# Patient Record
Sex: Male | Born: 1970 | Race: Black or African American | Hispanic: Refuse to answer | Marital: Single | Smoking: Never smoker
Health system: Southern US, Community
[De-identification: ages and names within clinical notes are randomized; demographics above are authoritative.]

---

## 2002-03-17 ENCOUNTER — Ambulatory Visit (HOSPITAL_COMMUNITY): Admission: RE | Admit: 2002-03-17 | Discharge: 2002-03-17 | Payer: Self-pay | Admitting: Orthopedic Surgery

## 2002-03-17 ENCOUNTER — Encounter: Payer: Self-pay | Admitting: Orthopedic Surgery

## 2004-08-23 ENCOUNTER — Emergency Department (HOSPITAL_COMMUNITY): Admission: EM | Admit: 2004-08-23 | Discharge: 2004-08-23 | Payer: Self-pay | Admitting: Emergency Medicine

## 2005-12-25 ENCOUNTER — Emergency Department (HOSPITAL_COMMUNITY): Admission: EM | Admit: 2005-12-25 | Discharge: 2005-12-25 | Payer: Self-pay | Admitting: Family Medicine

## 2008-02-24 ENCOUNTER — Emergency Department (HOSPITAL_COMMUNITY): Admission: EM | Admit: 2008-02-24 | Discharge: 2008-02-24 | Payer: Self-pay | Admitting: Family Medicine

## 2008-02-29 ENCOUNTER — Emergency Department (HOSPITAL_COMMUNITY): Admission: EM | Admit: 2008-02-29 | Discharge: 2008-02-29 | Payer: Self-pay | Admitting: Family Medicine

## 2012-05-22 ENCOUNTER — Encounter (HOSPITAL_COMMUNITY): Payer: Self-pay | Admitting: *Deleted

## 2012-05-22 ENCOUNTER — Emergency Department (HOSPITAL_COMMUNITY)
Admission: EM | Admit: 2012-05-22 | Discharge: 2012-05-22 | Disposition: A | Discharge status: Home / Self Care | Payer: Self-pay | Source: Home / Self Care | Attending: Emergency Medicine | Admitting: Emergency Medicine

## 2012-05-22 DIAGNOSIS — L0291 Cutaneous abscess, unspecified: Secondary | ICD-10-CM

## 2012-05-22 DIAGNOSIS — L0390 Cellulitis, unspecified: Secondary | ICD-10-CM

## 2012-05-22 DIAGNOSIS — R52 Pain, unspecified: Secondary | ICD-10-CM

## 2012-05-22 MED ORDER — TRAMADOL HCL 50 MG PO TABS: 50.0000 mg | ORAL_TABLET | Freq: Four times a day (QID) | ORAL | Status: AC | PRN

## 2012-05-22 MED ORDER — DOXYCYCLINE HYCLATE 100 MG PO CAPS: 100.0000 mg | ORAL_CAPSULE | Freq: Two times a day (BID) | ORAL | Status: AC

## 2012-05-22 NOTE — ED Provider Notes (Signed)
Medical screening examination/treatment/procedure(s) were performed by non-physician practitioner and as supervising physician I was immediately available for consultation/collaboration.  Luiz Blare MD   Luiz Blare, MD 05/22/12 2103

## 2012-05-22 NOTE — ED Notes (Signed)
Pt reports abscess in center of chest

## 2012-05-22 NOTE — ED Provider Notes (Signed)
History     CSN: [id number]  Arrival date & time 05/22/12  1807   First MD Initiated Contact with Patient 05/22/12 1828      Chief Complaint  Patient presents with  . Abscess    (Consider location/radiation/quality/duration/timing/severity/associated sxs/prior treatment) Patient is a 41 y.o. male presenting with abscess. The history is provided by the patient.  Abscess   This patient presents for evaluation of a cutaneous abscess.  The lesion is located in the chest.  Onset was 5 days ago.  Symptoms have been worsening.  Abscess has associated symptoms of tenderness, minimal drainage.  The patient does not have previous history of cutaneous abscesses.  There is no known previous history of MRSA.   They do not have a history of diabetes. Has applied warm compresses with minimal relief in symptoms.   History reviewed. No pertinent past medical history.  History reviewed. No pertinent past surgical history.  Family History  Problem Relation Age of Onset  . Family history unknown: Yes    History  Substance Use Topics  . Smoking status: Never Smoker   . Smokeless tobacco: Not on file  . Alcohol Use: Yes     socially      Review of Systems  Constitutional: Negative.   Respiratory: Negative.   Cardiovascular: Negative.   Skin: Positive for wound. Negative for color change, pallor and rash.    Allergies  Review of patient's allergies indicates no known allergies.  Home Medications   Current Outpatient Rx  Name Route Sig Dispense Refill  . DOXYCYCLINE HYCLATE 100 MG PO CAPS Oral Take 1 capsule (100 mg total) by mouth 2 (two) times daily. 14 capsule 0  . TRAMADOL HCL 50 MG PO TABS Oral Take 1 tablet (50 mg total) by mouth every 6 (six) hours as needed for pain. 15 tablet 0    BP 121/75  Pulse 70  Temp 98.1 F (36.7 C) (Oral)  Resp 16  SpO2 99%  Physical Exam  Nursing note and vitals reviewed. Constitutional: He is oriented to person, place, and time.  Vital signs are normal. He appears well-developed and well-nourished. He is active and cooperative.  HENT:  Head: Normocephalic.  Eyes: Conjunctivae are normal. Pupils are equal, round, and reactive to light. No scleral icterus.  Neck: Trachea normal. Neck supple.  Cardiovascular: Normal rate, regular rhythm and normal heart sounds.   Pulmonary/Chest: Effort normal and breath sounds normal.         3 x 3 abscess on anterior chest, tenderness and erythema noted.  Lymphadenopathy:    He has no cervical adenopathy.    He has no axillary adenopathy.  Neurological: He is alert and oriented to person, place, and time. No cranial nerve deficit or sensory deficit.  Skin: Skin is warm and dry.  Psychiatric: He has a normal mood and affect. His speech is normal and behavior is normal. Judgment and thought content normal. Cognition and memory are normal.    ED Course  INCISION AND DRAINAGE Date/Time: 05/22/2012 7:34 PM Performed by: Johnsie Kindred Authorized by: Luiz Blare Consent: Verbal consent obtained. Risks and benefits: risks, benefits and alternatives were discussed Consent given by: patient Patient understanding: patient states understanding of the procedure being performed Patient consent: the patient's understanding of the procedure matches consent given Procedure consent: procedure consent matches procedure scheduled Site marked: the operative site was marked Patient identity confirmed: verbally with patient and arm band Time out: Immediately prior to procedure a "time out"  was called to verify the correct patient, procedure, equipment, support staff and site/side marked as required. Type: abscess Body area: trunk Location details: chest Anesthesia: local infiltration Local anesthetic: lidocaine 1% without epinephrine Anesthetic total: 2 ml Patient sedated: no Scalpel size: 11 Needle gauge: 22 Incision type: elliptical (cross) Complexity: simple Drainage:  purulent and bloody Drainage amount: moderate Wound treatment: wound left open Patient tolerance: Patient tolerated the procedure well with no immediate complications.   (including critical care time)   Labs Reviewed  CULTURE, ROUTINE-ABSCESS   No results found.   1. Abscess and cellulitis   2. Pain       MDM  Take antibiotics as prescribed.  Take ibuprofen for pain and ultram for worse pain.  Follow up in 3 days for wound recheck.        Johnsie Kindred, NP 05/22/12 2050

## 2012-05-26 LAB — CULTURE, ROUTINE-ABSCESS: Special Requests: NORMAL

## 2019-11-17 ENCOUNTER — Ambulatory Visit: Payer: 59 | Admitting: Nurse Practitioner

## 2019-11-17 ENCOUNTER — Other Ambulatory Visit: Payer: Self-pay

## 2019-11-17 ENCOUNTER — Encounter: Payer: Self-pay | Admitting: Nurse Practitioner

## 2019-11-17 VITALS — BP 112/80 | HR 64 | Temp 98.2°F | Ht 65.0 in | Wt 138.8 lb

## 2019-11-17 DIAGNOSIS — Z0000 Encounter for general adult medical examination without abnormal findings: Secondary | ICD-10-CM

## 2019-11-17 DIAGNOSIS — Z125 Encounter for screening for malignant neoplasm of prostate: Secondary | ICD-10-CM

## 2019-11-17 DIAGNOSIS — Z23 Encounter for immunization: Secondary | ICD-10-CM

## 2019-11-17 DIAGNOSIS — H6122 Impacted cerumen, left ear: Secondary | ICD-10-CM

## 2019-11-17 LAB — POCT URINALYSIS DIPSTICK
Bilirubin, UA: NEGATIVE
Blood, UA: NEGATIVE
Glucose, UA: NEGATIVE
Ketones, UA: NEGATIVE
Leukocytes, UA: NEGATIVE
Nitrite, UA: NEGATIVE
Protein, UA: NEGATIVE
Spec Grav, UA: 1.015 (ref 1.010–1.025)
Urobilinogen, UA: 0.2 E.U./dL
pH, UA: 5.5 (ref 5.0–8.0)

## 2019-11-17 MED ORDER — TETANUS-DIPHTH-ACELL PERTUSSIS 5-2.5-18.5 LF-MCG/0.5 IM SUSP
0.5000 mL | Freq: Once | INTRAMUSCULAR | Status: AC
  Administered 2019-11-17: 0.5 mL via INTRAMUSCULAR

## 2019-11-17 NOTE — Progress Notes (Addendum)
This visit occurred during the SARS-CoV-2 public health emergency.  Safety protocols were in place, including screening questions prior to the visit, additional usage of staff PPE, and extensive cleaning of exam room while observing appropriate contact time as indicated for disinfecting solutions.  Subjective:     Patient ID: Ethan Davies , male    DOB: [birth year] , 49 y.o.   MRN: [id number]   Chief Complaint  Patient presents with  . Establish Care  . Annual Exam    HPI  Here to establish care - he was seeing a PCP in [place].  He had been here previously about 8 years ago.  When [employer] he stopped coming to the office.  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    No children, single.   Mother - lung cancer with metastasis to brain.  Father - aneurysm brain at age 53.  Brother - Chief Strategy Officer (healthy) and Sallie lung cancer.  Will have his labs sent to [place] from his life insurance.   Also, here for HM     History reviewed. No pertinent past medical history.   Family History  Problem Relation Age of Onset  . Cancer Mother   . Aneurysm Father   . Healthy Sister   . Healthy Brother   . Hypertension Brother     No current outpatient medications on file.   No Known Allergies   Review of Systems  Constitutional: Negative.   HENT: Negative.   Eyes: Negative.   Respiratory: Negative.   Cardiovascular: Negative.   Gastrointestinal: Negative.   Endocrine: Negative.   Genitourinary: Negative.   Musculoskeletal: Negative.   Skin: Negative.   Neurological: Negative.   Hematological: Negative.   Psychiatric/Behavioral: Negative.      Today's Vitals   11/17/19 1014  BP: 112/80  Pulse: 64  Temp: 98.2 F (36.8 C)  TempSrc: Oral  Weight: 138 lb 12.8 oz (63 kg)  Height: 5\' 5"  (1.651 m)  PainSc: 0-No pain   Body mass index is 23.1 kg/m.   Objective:  Physical Exam Vitals reviewed.  Constitutional:      General: He is not in acute distress.  Appearance: Normal appearance.  HENT:     Head: Normocephalic and atraumatic.     Right Ear: Tympanic membrane, ear canal and external ear normal. There is no impacted cerumen.     Left Ear: External ear normal. There is impacted cerumen (firm cerumen present in canal).  Cardiovascular:     Rate and Rhythm: Normal rate and regular rhythm.     Pulses: Normal pulses.     Heart sounds: Normal heart sounds. No murmur.  Pulmonary:     Effort: Pulmonary effort is normal. No respiratory distress.     Breath sounds: Normal breath sounds.  Abdominal:     General: Abdomen is flat. Bowel sounds are normal. There is no distension.     Palpations: Abdomen is soft.  Musculoskeletal:        General: Normal range of motion.     Cervical back: Normal range of motion and neck supple.  Skin:    General: Skin is warm.     Capillary Refill: Capillary refill takes less than 2 seconds.  Neurological:     General: No focal deficit present.     Mental Status: He is alert and oriented to person, place, and time.  Psychiatric:        Mood and Affect: Mood normal.  Behavior: Behavior normal.        Thought Content: Thought content normal.        Judgment: Judgment normal.         Assessment And Plan:     1. Health maintenance examination . Behavior modifications discussed and diet history reviewed.   . Pt will continue to exercise regularly and modify diet with low GI, plant based foods and decrease intake of processed foods.  . Recommend intake of daily multivitamin, Vitamin D, and calcium.  . Recommend to call insurance company to see if a colonoscopy for preventive screenings is covered before age 23, as well as recommend immunizations that include influenza, TDAP  2. Impacted cerumen of left ear  Water lavage done with good results  3. Encounter for prostate cancer screening  4. Encounter for immunization  Will give tetanus vaccine today while in office. Refer to order management. TDAP  will be administered to adults 62-25 years old every 10 years. - Tdap (BOOSTRIX) injection 0.5 mL    Arnette Felts, FNP    THE PATIENT IS ENCOURAGED TO PRACTICE SOCIAL DISTANCING DUE TO THE COVID-19 PANDEMIC.

## 2019-11-18 LAB — PSA: Prostate Specific Ag, Serum: 0.7 ng/mL (ref 0.0–4.0)

## 2020-01-12 ENCOUNTER — Ambulatory Visit: Payer: 59 | Attending: Internal Medicine

## 2020-01-12 DIAGNOSIS — Z20822 Contact with and (suspected) exposure to covid-19: Secondary | ICD-10-CM | POA: Insufficient documentation

## 2020-01-13 LAB — NOVEL CORONAVIRUS, NAA: SARS-CoV-2, NAA: NOT DETECTED

## 2020-01-13 LAB — SARS-COV-2, NAA 2 DAY TAT

## 2020-06-14 ENCOUNTER — Encounter: Payer: Self-pay | Admitting: Nurse Practitioner

## 2020-06-14 ENCOUNTER — Other Ambulatory Visit: Payer: Self-pay

## 2020-06-14 ENCOUNTER — Ambulatory Visit: Payer: 59 | Admitting: Nurse Practitioner

## 2020-06-14 VITALS — BP 118/68 | HR 69 | Temp 98.2°F | Ht 65.0 in | Wt 131.4 lb

## 2020-06-14 DIAGNOSIS — M79605 Pain in left leg: Secondary | ICD-10-CM | POA: Diagnosis not present

## 2020-06-14 DIAGNOSIS — M542 Cervicalgia: Secondary | ICD-10-CM | POA: Diagnosis not present

## 2020-06-14 DIAGNOSIS — M25512 Pain in left shoulder: Secondary | ICD-10-CM

## 2020-06-14 MED ORDER — KETOROLAC TROMETHAMINE 60 MG/2ML IM SOLN
60.0000 mg | Freq: Once | INTRAMUSCULAR | Status: AC
  Administered 2020-06-14: 60 mg via INTRAMUSCULAR

## 2020-06-14 MED ORDER — CYCLOBENZAPRINE HCL 10 MG PO TABS: 10.0000 mg | ORAL_TABLET | Freq: Three times a day (TID) | ORAL | 0 refills | Status: DC | PRN

## 2020-06-14 MED ORDER — IBUPROFEN 800 MG PO TABS: 800.0000 mg | ORAL_TABLET | Freq: Three times a day (TID) | ORAL | 0 refills | Status: DC | PRN
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
## 2020-06-15 ENCOUNTER — Ambulatory Visit
Admission: RE | Admit: 2020-06-15 | Discharge: 2020-06-15 | Disposition: A | Discharge status: Home / Self Care | Payer: 59 | Source: Ambulatory Visit | Attending: Nurse Practitioner | Admitting: Nurse Practitioner

## 2020-06-15 DIAGNOSIS — M25512 Pain in left shoulder: Secondary | ICD-10-CM

## 2020-06-15 DIAGNOSIS — M542 Cervicalgia: Secondary | ICD-10-CM

## 2020-07-14 ENCOUNTER — Encounter: Payer: Self-pay | Admitting: Nurse Practitioner

## 2020-11-18 ENCOUNTER — Encounter: Payer: 59 | Admitting: Nurse Practitioner

## 2021-07-12 ENCOUNTER — Other Ambulatory Visit: Payer: Self-pay | Admitting: General Practice

## 2021-07-12 DIAGNOSIS — R590 Localized enlarged lymph nodes: Secondary | ICD-10-CM

## 2021-07-13 ENCOUNTER — Ambulatory Visit
Admission: RE | Admit: 2021-07-13 | Discharge: 2021-07-13 | Disposition: A | Discharge status: Home / Self Care | Payer: 59 | Source: Ambulatory Visit | Attending: General Practice | Admitting: General Practice

## 2021-07-13 DIAGNOSIS — R590 Localized enlarged lymph nodes: Secondary | ICD-10-CM

## 2021-08-02 ENCOUNTER — Other Ambulatory Visit: Payer: Self-pay

## 2021-08-02 ENCOUNTER — Encounter: Payer: Self-pay | Admitting: Nurse Practitioner

## 2021-08-02 ENCOUNTER — Ambulatory Visit (INDEPENDENT_AMBULATORY_CARE_PROVIDER_SITE_OTHER): Payer: 59 | Admitting: Nurse Practitioner

## 2021-08-02 VITALS — BP 134/86 | HR 70 | Temp 97.8°F | Ht 63.8 in | Wt 134.4 lb

## 2021-08-02 DIAGNOSIS — R609 Edema, unspecified: Secondary | ICD-10-CM

## 2021-08-02 DIAGNOSIS — Z114 Encounter for screening for human immunodeficiency virus [HIV]: Secondary | ICD-10-CM

## 2021-08-02 DIAGNOSIS — Z6823 Body mass index (BMI) 23.0-23.9, adult: Secondary | ICD-10-CM

## 2021-08-02 DIAGNOSIS — Z0000 Encounter for general adult medical examination without abnormal findings: Secondary | ICD-10-CM

## 2021-08-02 DIAGNOSIS — H6122 Impacted cerumen, left ear: Secondary | ICD-10-CM

## 2021-08-02 DIAGNOSIS — Z1159 Encounter for screening for other viral diseases: Secondary | ICD-10-CM

## 2021-08-02 DIAGNOSIS — Z1211 Encounter for screening for malignant neoplasm of colon: Secondary | ICD-10-CM

## 2021-08-02 DIAGNOSIS — Z2821 Immunization not carried out because of patient refusal: Secondary | ICD-10-CM

## 2021-08-02 DIAGNOSIS — Z125 Encounter for screening for malignant neoplasm of prostate: Secondary | ICD-10-CM

## 2021-08-02 NOTE — Progress Notes (Signed)
I,Ethan Davies,acting as a Education administrator for [employer], NP.,have documented all relevant documentation on the behalf of [employer], NP,as directed by  Bary Castilla, NP while in the presence of Bary Castilla, NP.  This visit occurred during the SARS-CoV-2 public health emergency.  Safety protocols were in place, including screening questions prior to the visit, additional usage of staff PPE, and extensive cleaning of exam room while observing appropriate contact time as indicated for disinfecting solutions.  Subjective:     Patient ID: Ethan Davies , male    DOB: 1971/09/01 , 50 y.o.   MRN: [id number]   Chief Complaint  Patient presents with   Annual Exam    HPI  Patient is here for hm. No concerns today. He has appt ENT Dec. 9th. For his swollen gland under his neck. CT has already been done for it and he has been referred to ENT for it.  Diet: He is eating healthy  Exercise: he is working out and doing weights.  Drinks occasionally. Does not smoke.   Wt Readings from Last 3 Encounters: 08/02/21 : 134 lb 6.4 oz (61 kg) 06/14/20 : 131 lb 6.4 oz (59.6 kg) 11/17/19 : 138 lb 12.8 oz (63 kg)      No past medical history on file.   Family History  Problem Relation Age of Onset   Cancer Mother    Aneurysm Father    Healthy Sister    Healthy Brother    Hypertension Brother     No current outpatient medications on file.   No Known Allergies   Men's preventive visit. Patient Health Questionnaire (PHQ-2) is  Flowsheet Row Office Visit from 08/02/2021 in [facility]  PHQ-2 Total Score 0     . Patient is on a not on a diet diet. Marital status: Single. Relevant history for alcohol use is:  Social History   Substance and Sexual Activity  Alcohol Use Yes   Comment: socially  . Relevant history for tobacco use is:  Social History   Tobacco Use  Smoking Status Never  Smokeless Tobacco Never  .   Review of Systems   Constitutional: Negative.  Negative for chills, fatigue and fever.  HENT: Negative.  Negative for congestion, sinus pressure and sinus pain.   Eyes: Negative.   Respiratory: Negative.  Negative for cough, shortness of breath and wheezing.   Cardiovascular: Negative.  Negative for chest pain and palpitations.  Gastrointestinal: Negative.  Negative for constipation, diarrhea and vomiting.  Endocrine: Negative.  Negative for polydipsia, polyphagia and polyuria.  Genitourinary: Negative.  Negative for frequency.  Musculoskeletal: Negative.  Negative for arthralgias, myalgias, neck pain and neck stiffness.       Swelling under his neck   Skin: Negative.   Allergic/Immunologic: Negative.   Neurological: Negative.  Negative for weakness and headaches.  Hematological: Negative.   Psychiatric/Behavioral: Negative.      Today's Vitals   08/02/21 1536  BP: 134/86  Pulse: 70  Temp: 97.8 F (36.6 C)  Weight: 134 lb 6.4 oz (61 kg)  Height: 5' 3.8" (1.621 m)  PainSc: 0-No pain   Body mass index is 23.21 kg/m.   Objective:  Physical Exam Vitals and nursing note reviewed.  Constitutional:      Appearance: Normal appearance.  HENT:     Head: Normocephalic and atraumatic.     Right Ear: Tympanic membrane, ear canal and external ear normal. There is no impacted cerumen.     Left Ear: Tympanic  membrane, ear canal and external ear normal. There is impacted cerumen.     Nose: Nose normal. No congestion or rhinorrhea.     Mouth/Throat:     Mouth: Mucous membranes are moist.     Pharynx: Oropharynx is clear.  Eyes:     Extraocular Movements: Extraocular movements intact.     Conjunctiva/sclera: Conjunctivae normal.     Pupils: Pupils are equal, round, and reactive to light.  Neck:     Comments: Submandibular gland swelling  Cardiovascular:     Rate and Rhythm: Normal rate and regular rhythm.     Pulses: Normal pulses.     Heart sounds: Normal heart sounds. No murmur heard. Pulmonary:      Effort: Pulmonary effort is normal. No respiratory distress.     Breath sounds: Normal breath sounds. No wheezing.  Chest:  Breasts:    Right: Normal. No swelling, bleeding, inverted nipple, mass or nipple discharge.     Left: Normal. No swelling, bleeding, inverted nipple, mass or nipple discharge.  Abdominal:     General: Abdomen is flat. Bowel sounds are normal. There is no distension.     Palpations: Abdomen is soft.     Tenderness: There is no abdominal tenderness.  Genitourinary:    Comments: Deferred  Musculoskeletal:        General: Normal range of motion.     Cervical back: Normal range of motion and neck supple. Tenderness present.  Skin:    General: Skin is warm and dry.     Capillary Refill: Capillary refill takes less than 2 seconds.  Neurological:     General: No focal deficit present.     Mental Status: He is alert and oriented to person, place, and time.  Psychiatric:        Mood and Affect: Mood normal.        Behavior: Behavior normal.        Assessment And Plan:    1. Encounter for general adult medical examination w/o abnormal findings -Patient is here for their annual physical exam and we discussed any changes to medication and medical history.  -Behavior modification was discussed as well as diet and exercise history  -Patient will continue to exercise regularly and modify their diet.  -Recommendation for yearly physical annuals, immunization and screenings including mammogram and colonoscopy were discussed with the patient.  -Recommended intake of multivitamin, vitamin D and calcium.  -Individualized advise was given to the patient pertaining to their own health history in regards to diet, exercise, medical condition and referrals.  - CMP14+EGFR - Lipid panel - CBC no Diff - Hemoglobin A1c  2. Submandibular gland swelling -Reviewed CT  -Patient has appt with ENT on Dec 9th   3. Encounter for screening for human immunodeficiency virus (HIV) - HIV  antibody (with reflex)  4. Encounter for hepatitis C screening test for low risk patient - Hepatitis C antibody  5. Screening for colon cancer - Ambulatory referral to Gastroenterology  6. Influenza vaccination declined -Education given on the influenza vaccine   7. BMI 23.0-23.9, adult -Advised patient on a healthy diet including avoiding fast food and red meats. Increase the intake of lean meats including grilled chicken and [place].  Drink a lot of water. Decrease intake of fatty foods. Exercise for 30-45 Davies. 4-5 a week to decrease the risk of cardiac event.   8. Impacted cerumen of left ear -Flushed and cleaned with curette.   9. Encounter for prostate cancer screening - PSA  The patient was encouraged to call or send a message through [place] for any questions or concerns.   Follow up: if symptoms persist or do not get better.   Side effects and appropriate use of all the medication(s) were discussed with the patient today. Patient advised to use the medication(s) as directed by their healthcare provider. The patient was encouraged to read, review, and understand all associated package inserts and contact our office with any questions or concerns. The patient accepts the risks of the treatment plan and had an opportunity to ask questions.   Staying healthy and adopting a healthy lifestyle for your overall health is important. You should eat 7 or more servings of fruits and vegetables per day. You should drink plenty of water to keep yourself hydrated and your kidneys healthy. This includes about 65-80+ fluid ounces of water. Limit your intake of animal fats especially for elevated cholesterol. Avoid highly processed food and limit your salt intake if you have hypertension. Avoid foods high in saturated/Trans fats. Along with a healthy diet it is also very important to maintain time for yourself to maintain a healthy mental health with low stress levels. You should get atleast 150 Davies  of moderate intensity exercise weekly for a healthy heart. Along with eating right and exercising, aim for at least 7-9 hours of sleep daily.  Eat more whole grains which includes barley, wheat berries, oats, brown rice and whole wheat pasta. Use healthy plant oils which include olive, soy, corn, sunflower and peanut. Limit your caffeine and sugary drinks. Limit your intake of fast foods. Limit milk and dairy products to one or two daily servings.   Patient was given opportunity to ask questions. Patient verbalized understanding of the plan and was able to repeat key elements of the plan. All questions were answered to their satisfaction.  Ethan Vihana Kydd, DNP   I, Ethan Davies have reviewed all documentation for this visit. The documentation on 08/02/21 for the exam, diagnosis, procedures, and orders are all accurate and complete.    THE PATIENT IS ENCOURAGED TO PRACTICE SOCIAL DISTANCING DUE TO THE COVID-19 PANDEMIC.

## 2021-08-02 NOTE — Patient Instructions (Signed)

## 2021-08-03 LAB — CMP14+EGFR
ALT: 22 IU/L (ref 0–44)
AST: 25 IU/L (ref 0–40)
Albumin/Globulin Ratio: 1.7 (ref 1.2–2.2)
Albumin: 4.7 g/dL (ref 4.0–5.0)
Alkaline Phosphatase: 60 IU/L (ref 44–121)
BUN/Creatinine Ratio: 18 (ref 9–20)
BUN: 18 mg/dL (ref 6–24)
Bilirubin Total: 0.6 mg/dL (ref 0.0–1.2)
CO2: 26 mmol/L (ref 20–29)
Calcium: 9.5 mg/dL (ref 8.7–10.2)
Chloride: 98 mmol/L (ref 96–106)
Creatinine, Ser: 1.01 mg/dL (ref 0.76–1.27)
Globulin, Total: 2.8 g/dL (ref 1.5–4.5)
Glucose: 86 mg/dL (ref 70–99)
Potassium: 5.1 mmol/L (ref 3.5–5.2)
Sodium: 139 mmol/L (ref 134–144)
Total Protein: 7.5 g/dL (ref 6.0–8.5)
eGFR: 91 mL/min/{1.73_m2} (ref >59)

## 2021-08-03 LAB — LIPID PANEL
Chol/HDL Ratio: 2.4 ratio (ref 0.0–5.0)
Cholesterol, Total: 168 mg/dL (ref 100–199)
HDL: 69 mg/dL (ref >39)
LDL Chol Calc (NIH): 81 mg/dL (ref 0–99)
Triglycerides: 101 mg/dL (ref 0–149)
VLDL Cholesterol Cal: 18 mg/dL (ref 5–40)

## 2021-08-03 LAB — PSA: Prostate Specific Ag, Serum: 1 ng/mL (ref 0.0–4.0)

## 2021-08-03 LAB — CBC
Hematocrit: 50.2 % (ref 37.5–51.0)
Hemoglobin: 16.8 g/dL (ref 13.0–17.7)
MCH: 31.9 pg (ref 26.6–33.0)
MCHC: 33.5 g/dL (ref 31.5–35.7)
MCV: 95 fL (ref 79–97)
Platelets: 287 10*3/uL (ref 150–450)
RBC: 5.26 x10E6/uL (ref 4.14–5.80)
RDW: 11.4 % — ABNORMAL LOW (ref 11.6–15.4)
WBC: 5.6 10*3/uL (ref 3.4–10.8)

## 2021-08-03 LAB — HEMOGLOBIN A1C
Est. average glucose Bld gHb Est-mCnc: 91 mg/dL
Hgb A1c MFr Bld: 4.8 % (ref 4.8–5.6)

## 2021-08-03 LAB — HIV ANTIBODY (ROUTINE TESTING W REFLEX): HIV Screen 4th Generation wRfx: NONREACTIVE

## 2021-08-03 LAB — HEPATITIS C ANTIBODY: Hep C Virus Ab: <0.1 s/co ratio (ref 0.0–0.9)

## 2021-10-18 ENCOUNTER — Encounter: Payer: Self-pay | Admitting: Nurse Practitioner

## 2021-10-18 DIAGNOSIS — Z1211 Encounter for screening for malignant neoplasm of colon: Secondary | ICD-10-CM | POA: Diagnosis not present

## 2021-10-18 LAB — HM COLONOSCOPY
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
## 2022-05-21 DIAGNOSIS — W01110A Fall on same level from slipping, tripping and stumbling with subsequent striking against sharp glass, initial encounter: Secondary | ICD-10-CM | POA: Diagnosis not present

## 2022-05-21 DIAGNOSIS — S61412A Laceration without foreign body of left hand, initial encounter: Secondary | ICD-10-CM | POA: Diagnosis not present

## 2022-05-21 DIAGNOSIS — Z23 Encounter for immunization: Secondary | ICD-10-CM | POA: Diagnosis not present

## 2022-05-21 DIAGNOSIS — S61422A Laceration with foreign body of left hand, initial encounter: Secondary | ICD-10-CM | POA: Diagnosis not present

## 2022-05-21 DIAGNOSIS — Z79899 Other long term (current) drug therapy: Secondary | ICD-10-CM | POA: Diagnosis not present

## 2022-06-06 ENCOUNTER — Ambulatory Visit (HOSPITAL_COMMUNITY)
Admission: EM | Admit: 2022-06-06 | Discharge: 2022-06-06 | Disposition: A | Discharge status: Home / Self Care | Payer: 59 | Attending: Family Medicine | Admitting: Family Medicine

## 2022-06-06 ENCOUNTER — Encounter (HOSPITAL_COMMUNITY): Payer: Self-pay | Admitting: *Deleted

## 2022-06-06 DIAGNOSIS — R200 Anesthesia of skin: Secondary | ICD-10-CM

## 2022-06-06 DIAGNOSIS — Z4802 Encounter for removal of sutures: Secondary | ICD-10-CM | POA: Diagnosis not present

## 2022-06-06 NOTE — Discharge Instructions (Addendum)
You can use the QR code/website at the back of the summary paperwork to schedule yourself a new patient appointment with primary care

## 2022-06-06 NOTE — ED Provider Notes (Signed)
[facility]    CSN: [id number] Arrival date & time: 06/06/22  1045      History   Chief Complaint Chief Complaint  Patient presents with   Extremity Laceration   Wound Check    Suture Removal, Done outside of [facility]     HPI Ethan Davies is a 51 y.o. male.    Wound Check   Here for suture removal and for numbness in his left thumb.  On August 27 he was carrying a glass vase and fell.  When he did that he sustained a laceration to his left palm.  He was seen at an outside emergency room and had the wound explored and irrigated.  There was no foreign body noted for sure.  [facility] was updated and he was sutured there.  He comes in today for suture removal and for referral for continued numbness of his left thumb.  He is right-handed but he works as a Paediatric nurse and he needs the best possible function of his left hand also.  History reviewed. No pertinent past medical history.  There are no problems to display for this patient.   History reviewed. No pertinent surgical history.     Home Medications    Prior to Admission medications   Not on File    Family History Family History  Problem Relation Age of Onset   Cancer Mother    Aneurysm Father    Healthy Sister    Healthy Brother    Hypertension Brother     Social History Social History   Tobacco Use   Smoking status: Never   Smokeless tobacco: Never  Substance Use Topics   Alcohol use: Yes    Comment: socially   Drug use: No     Allergies   Patient has no known allergies.   Review of Systems Review of Systems   Physical Exam Triage Vital Signs ED Triage Vitals  Enc Vitals Group     BP 06/06/22 1224 (!) 164/101     Pulse Rate 06/06/22 1224 (!) 58     Resp 06/06/22 1224 18     Temp 06/06/22 1224 98.2 F (36.8 C)     Temp Source 06/06/22 1224 Oral     SpO2 06/06/22 1224 98 %     Weight --      Height --      Head Circumference --      Peak Flow --      Pain Score 06/06/22  1223 0     Pain Loc --      Pain Edu? --      Excl. in GC? --    No data found.  Updated Vital Signs BP (!) 164/101 (BP Location: Right Arm)   Pulse (!) 58   Temp 98.2 F (36.8 C) (Oral)   Resp 18   SpO2 98%   Visual Acuity Right Eye Distance:   Left Eye Distance:   Bilateral Distance:    Right Eye Near:   Left Eye Near:    Bilateral Near:     Physical Exam Vitals reviewed.  Constitutional:      General: He is not in acute distress.    Appearance: He is not ill-appearing, toxic-appearing or diaphoretic.  Skin:    Comments: The laceration on the left palm is well-healed.  There is no drainage and there is no erythema or induration.  Neurological:     Mental Status: He is alert.     Comments: There  is decreased light touch sensation over the left thumb fingerpad and the palmar surface of the thumb.  He is able to flex and extend his thumb  Psychiatric:        Behavior: Behavior normal.      UC Treatments / Results  Labs (all labs ordered are listed, but only abnormal results are displayed) Labs Reviewed - No data to display  EKG   Radiology No results found.  Procedures Procedures (including critical care time)  Medications Ordered in UC Medications - No data to display  Initial Impression / Assessment and Plan / UC Course  I have reviewed the triage vital signs and the nursing notes.  Pertinent labs & imaging results that were available during my care of the patient were reviewed by me and considered in my medical decision making (see chart for details).        All sutures are removed without difficulty.  I have printed a referral for him for hand specialist; he is also shown how to schedule a new patient appointment with primary care, as he may need an authorization for referral from a PCP Final Clinical Impressions(s) / UC Diagnoses   Final diagnoses:  Thumb numbness  Visit for suture removal     Discharge Instructions      You can use  the QR code/website at the back of the summary paperwork to schedule yourself a new patient appointment with primary care      ED Prescriptions   None    PDMP not reviewed this encounter.   Zenia Resides, MD 06/06/22 [phone]

## 2022-06-06 NOTE — ED Triage Notes (Signed)
Pt has sutures in his left hand on 05/21/2022. He is here today for removal. He states that this thumb on his left hand is still numb and would like referral to a specialist.

## 2022-06-13 DIAGNOSIS — S61411A Laceration without foreign body of right hand, initial encounter: Secondary | ICD-10-CM | POA: Diagnosis not present

## 2022-06-13 DIAGNOSIS — S6432XA Injury of digital nerve of left thumb, initial encounter: Secondary | ICD-10-CM | POA: Diagnosis not present

## 2022-08-01 ENCOUNTER — Telehealth: Payer: Self-pay

## 2022-08-01 NOTE — Patient Outreach (Signed)
  Care Coordination   08/01/2022 Name: Ethan Davies MRN: [id number] DOB: 03/07/1971   Care Coordination Outreach Attempts:  An unsuccessful telephone outreach was attempted today to offer the patient information about available care coordination services as a benefit of their health plan.   Follow Up Plan:  Additional outreach attempts will be made to offer the patient care coordination information and services.   Encounter Outcome:  No Answer  Care Coordination Interventions Activated:  No   Care Coordination Interventions:  No, not indicated    Johnney Killian, RN, BSN, CCM Care Management Coordinator [facility] Phone: [phone]: [phone]

## 2022-08-07 ENCOUNTER — Encounter: Payer: 59 | Admitting: Nurse Practitioner

## 2023-05-23 IMAGING — US US SOFT TISSUE HEAD/NECK
1 series · 14 of 18 positions shown · non-contrast
Comparison: None.

CLINICAL DATA: Submandibular/submental palpable abnormality,
concern for adenopathy

EXAM:
ULTRASOUND OF HEAD/NECK SOFT TISSUES
TECHNIQUE: Ultrasound examination of the head and neck soft tissues was
performed in the area of clinical concern.

[Series 1: us soft tissue head/neck · 0.08mm/px · 18 acquisitions, 14 frames shown]
[im 1/18]
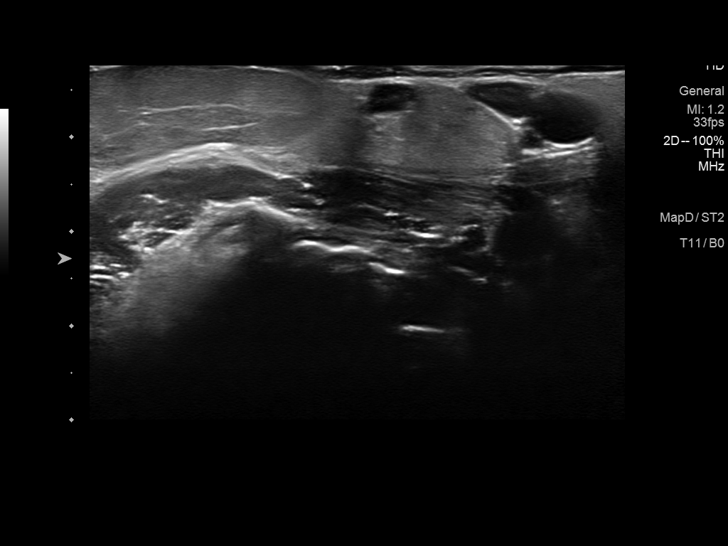
[im 2/18]
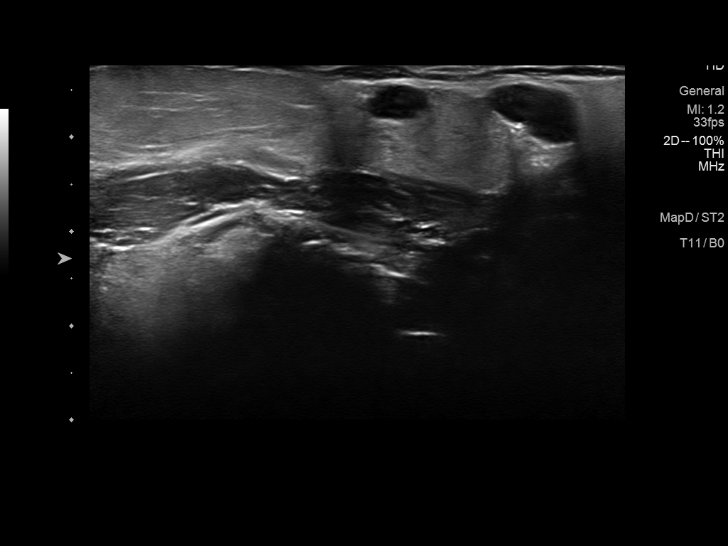
[im 4/18]
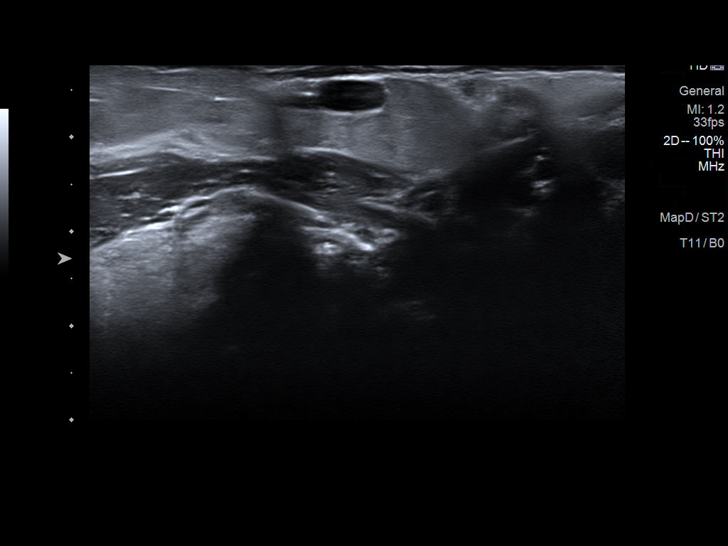
[im 5/18]
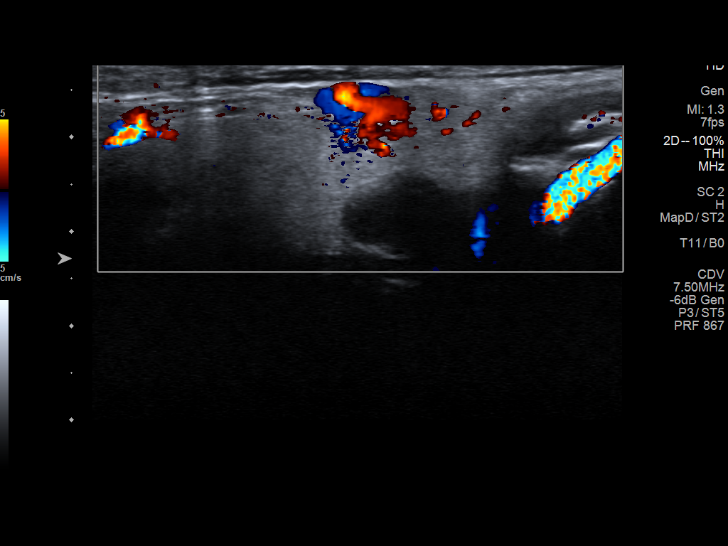
[im 6/18]
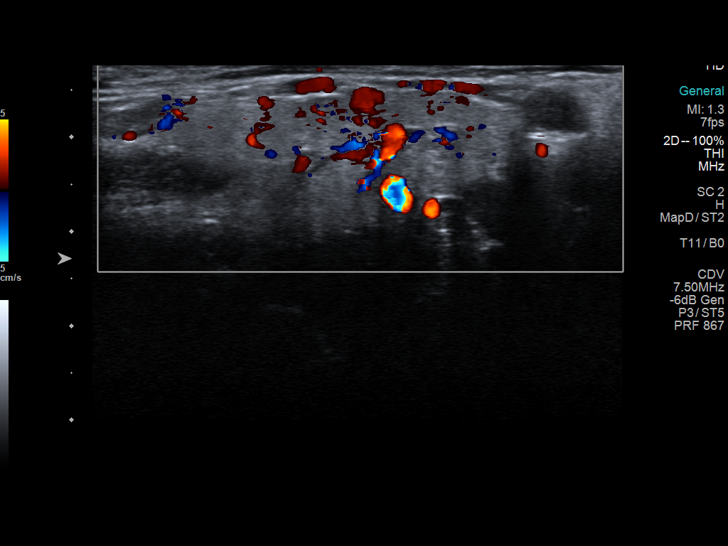
[im 8/18]
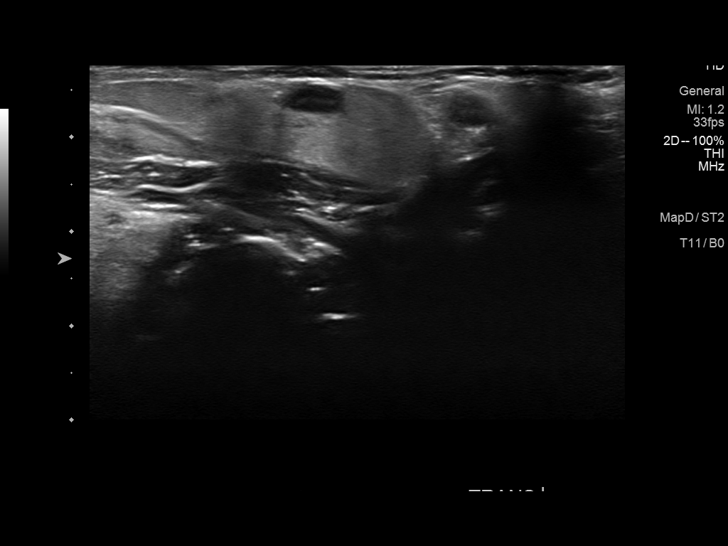
[im 9/18]
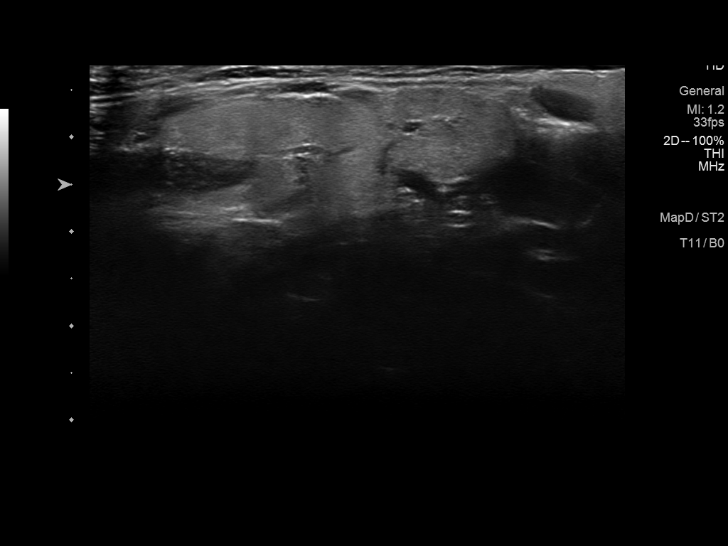
[im 10/18]
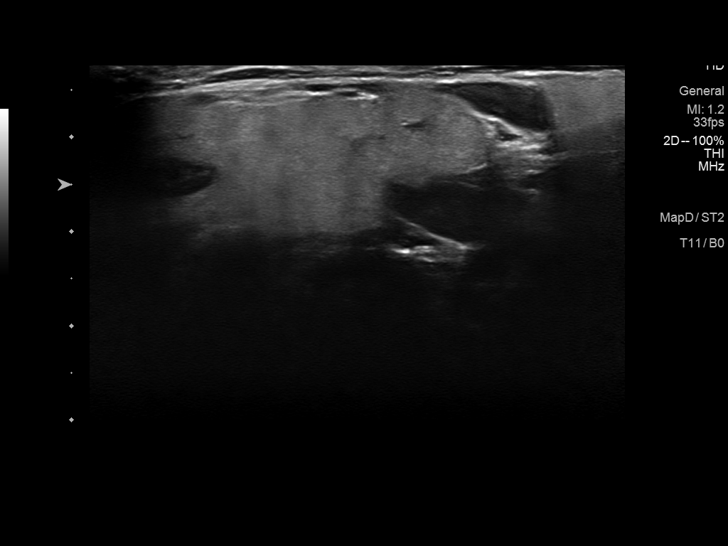
[im 11/18]
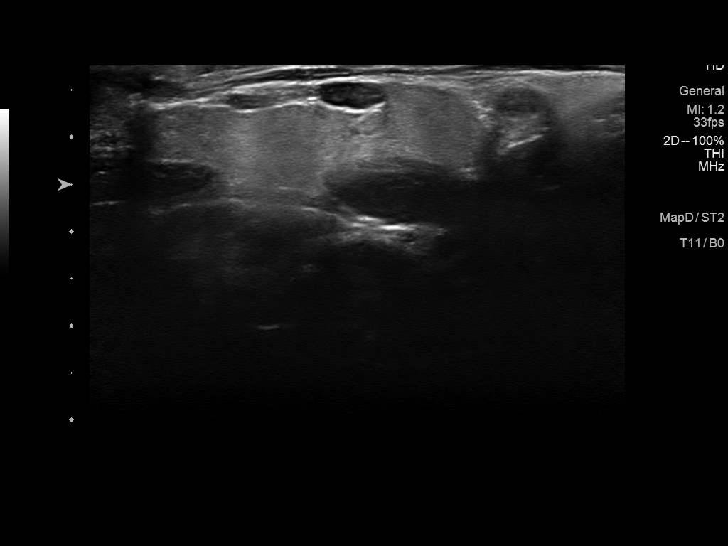
[im 13/18]
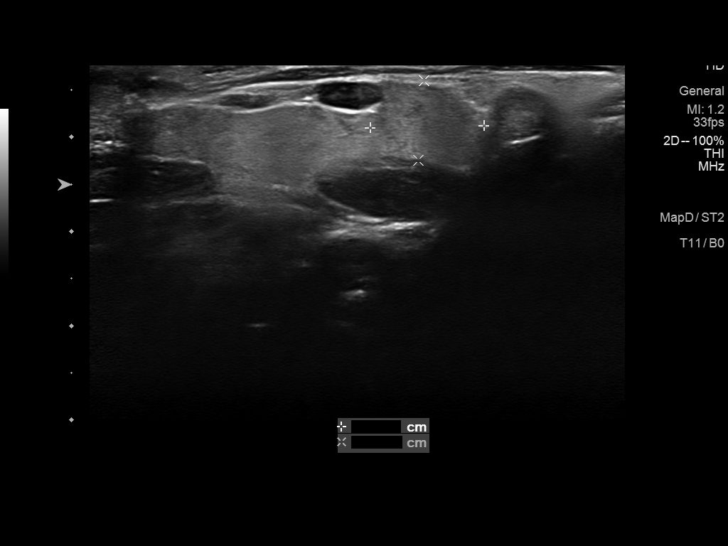
[im 14/18]
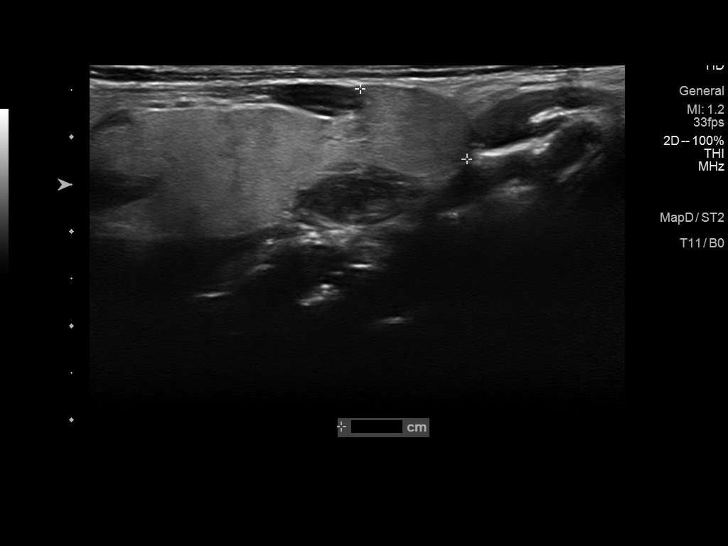
[im 15/18]
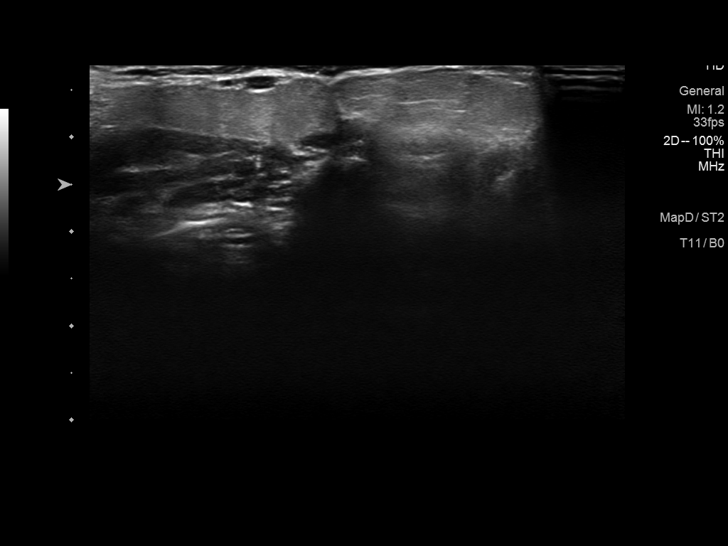
[im 17/18]
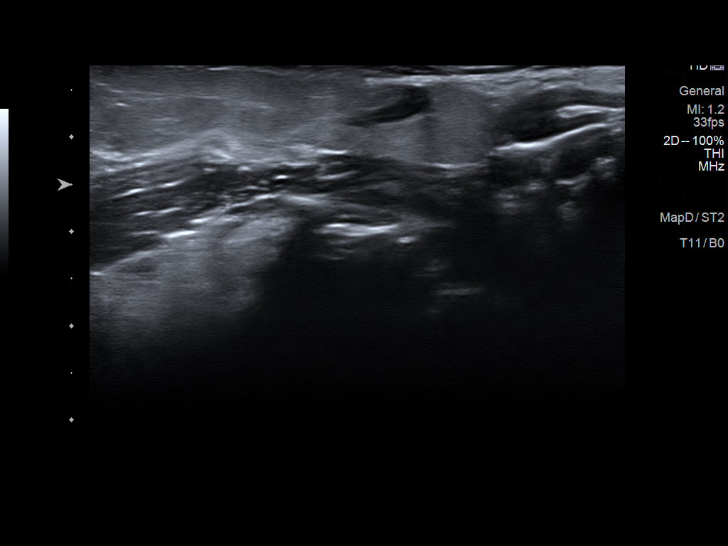
[im 18/18]
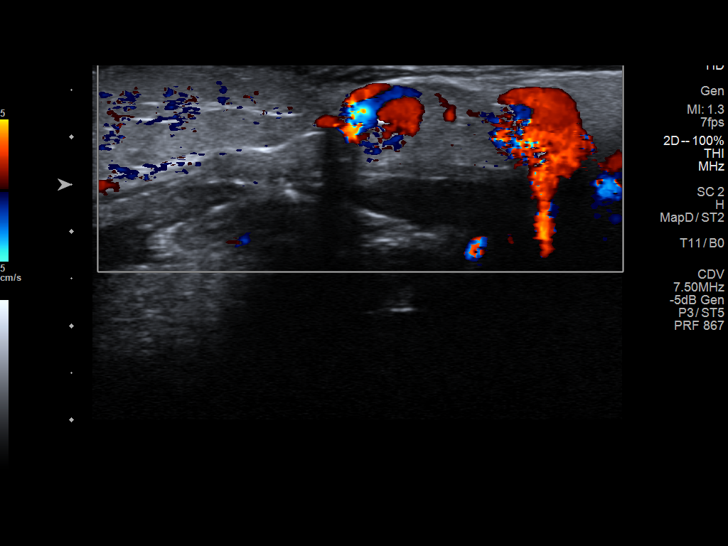

[14 of 18 positions shown; findings below may reference images not displayed]

FINDINGS: Ultrasound performed of the left submandibular/submental area of
concern. Slight asymmetry and prominence of the left submandibular
gland compared to the right, which is nonspecific. No other regional
soft tissue abnormality, mass, cyst, fluid collection, or bulky
adenopathy.
IMPRESSION: Nonspecific slight prominence of the left submandibular gland.

Negative for adenopathy by ultrasound.
